# Patient Record
Sex: Male | Born: 1995 | Race: Black or African American | Hispanic: No | Marital: Single | Smoking: Never smoker
Health system: Southern US, Community
[De-identification: ages and names within clinical notes are randomized; demographics above are authoritative.]

---
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
## 2019-08-03 ENCOUNTER — Emergency Department (HOSPITAL_COMMUNITY): Payer: Medicaid Other

## 2019-08-03 ENCOUNTER — Emergency Department (HOSPITAL_COMMUNITY)
Admission: EM | Admit: 2019-08-03 | Discharge: 2019-08-03 | Disposition: A | Discharge status: Home / Self Care | Payer: Medicaid Other | Attending: Emergency Medicine | Admitting: Emergency Medicine

## 2019-08-03 ENCOUNTER — Other Ambulatory Visit: Payer: Self-pay

## 2019-08-03 ENCOUNTER — Encounter (HOSPITAL_COMMUNITY): Payer: Self-pay | Admitting: *Deleted

## 2019-08-03 DIAGNOSIS — Y93I9 Activity, other involving external motion: Secondary | ICD-10-CM | POA: Diagnosis not present

## 2019-08-03 DIAGNOSIS — F19929 Other psychoactive substance use, unspecified with intoxication, unspecified: Secondary | ICD-10-CM | POA: Diagnosis not present

## 2019-08-03 DIAGNOSIS — Y999 Unspecified external cause status: Secondary | ICD-10-CM | POA: Insufficient documentation

## 2019-08-03 DIAGNOSIS — R1032 Left lower quadrant pain: Secondary | ICD-10-CM | POA: Insufficient documentation

## 2019-08-03 DIAGNOSIS — Y92410 Unspecified street and highway as the place of occurrence of the external cause: Secondary | ICD-10-CM | POA: Diagnosis not present

## 2019-08-03 DIAGNOSIS — R1012 Left upper quadrant pain: Secondary | ICD-10-CM | POA: Diagnosis not present

## 2019-08-03 DIAGNOSIS — R0789 Other chest pain: Secondary | ICD-10-CM

## 2019-08-03 DIAGNOSIS — R410 Disorientation, unspecified: Secondary | ICD-10-CM | POA: Diagnosis present

## 2019-08-03 DIAGNOSIS — R0781 Pleurodynia: Secondary | ICD-10-CM | POA: Insufficient documentation

## 2019-08-03 DIAGNOSIS — R109 Unspecified abdominal pain: Secondary | ICD-10-CM

## 2019-08-03 LAB — COMPREHENSIVE METABOLIC PANEL
ALT: 27 U/L (ref 0–44)
AST: 34 U/L (ref 15–41)
Albumin: 4.7 g/dL (ref 3.5–5.0)
Alkaline Phosphatase: 61 U/L (ref 38–126)
Anion gap: 11 (ref 5–15)
BUN: 12 mg/dL (ref 6–20)
CO2: 23 mmol/L (ref 22–32)
Calcium: 9.5 mg/dL (ref 8.9–10.3)
Chloride: 106 mmol/L (ref 98–111)
Creatinine, Ser: 1.21 mg/dL (ref 0.61–1.24)
GFR calc Af Amer: >60 mL/min (ref >60)
GFR calc non Af Amer: >60 mL/min (ref >60)
Glucose, Bld: 111 mg/dL — ABNORMAL HIGH (ref 70–99)
Potassium: 3.2 mmol/L — ABNORMAL LOW (ref 3.5–5.1)
Sodium: 140 mmol/L (ref 135–145)
Total Bilirubin: 0.9 mg/dL (ref 0.3–1.2)
Total Protein: 7.1 g/dL (ref 6.5–8.1)

## 2019-08-03 LAB — PROTIME-INR
INR: 1.2 (ref 0.8–1.2)
Prothrombin Time: 15.3 seconds — ABNORMAL HIGH (ref 11.4–15.2)

## 2019-08-03 LAB — SAMPLE TO BLOOD BANK

## 2019-08-03 LAB — CBC
HCT: 44.9 % (ref 39.0–52.0)
Hemoglobin: 14.5 g/dL (ref 13.0–17.0)
MCH: 28 pg (ref 26.0–34.0)
MCHC: 32.3 g/dL (ref 30.0–36.0)
MCV: 86.7 fL (ref 80.0–100.0)
Platelets: 276 10*3/uL (ref 150–400)
RBC: 5.18 MIL/uL (ref 4.22–5.81)
RDW: 13.1 % (ref 11.5–15.5)
WBC: 7.9 10*3/uL (ref 4.0–10.5)
nRBC: 0 % (ref 0.0–0.2)

## 2019-08-03 LAB — I-STAT CHEM 8, ED
BUN: 12 mg/dL (ref 6–20)
Calcium, Ion: 1.17 mmol/L (ref 1.15–1.40)
Chloride: 103 mmol/L (ref 98–111)
Creatinine, Ser: 1.2 mg/dL (ref 0.61–1.24)
Glucose, Bld: 106 mg/dL — ABNORMAL HIGH (ref 70–99)
HCT: 44 % (ref 39.0–52.0)
Hemoglobin: 15 g/dL (ref 13.0–17.0)
Potassium: 3.1 mmol/L — ABNORMAL LOW (ref 3.5–5.1)
Sodium: 140 mmol/L (ref 135–145)
TCO2: 26 mmol/L (ref 22–32)

## 2019-08-03 LAB — LACTIC ACID, PLASMA: Lactic Acid, Venous: 3.5 mmol/L (ref 0.5–1.9)

## 2019-08-03 LAB — CDS SEROLOGY

## 2019-08-03 LAB — ETHANOL: Alcohol, Ethyl (B): 13 mg/dL — ABNORMAL HIGH (ref <10)

## 2019-08-03 MED ORDER — IOHEXOL 300 MG/ML  SOLN
100.0000 mL | Freq: Once | INTRAMUSCULAR | Status: AC | PRN
  Administered 2019-08-03: 100 mL via INTRAVENOUS

## 2019-08-03 MED ORDER — SODIUM CHLORIDE 0.9 % IV BOLUS: 1000.0000 mL | Freq: Once | INTRAVENOUS | Status: DC

## 2019-08-03 MED ORDER — SODIUM CHLORIDE 0.9 % IV BOLUS
1000.0000 mL | Freq: Once | INTRAVENOUS | Status: AC
  Administered 2019-08-03: 1000 mL via INTRAVENOUS

## 2019-08-03 MED ORDER — HYDROMORPHONE HCL 1 MG/ML IJ SOLN
0.5000 mg | Freq: Once | INTRAMUSCULAR | Status: AC
  Administered 2019-08-03: 0.5 mg via INTRAVENOUS
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
## 2019-08-03 NOTE — ED Notes (Signed)
Terry Reese (Father#[phone]). [name] (Cousin)

## 2019-08-03 NOTE — ED Notes (Signed)
Pt ambulated w/ steady gait, NAD on ambulation, GCS 15, reports generalized soreness.

## 2019-08-03 NOTE — Progress Notes (Signed)
Orthopedic Tech Progress Note Patient Details:  Terry Reese 08/03/1873 [id number] Level 2 ortho visit. Patient ID: Terry Reese, male   DOB: 08/03/1873, 23 y.o.   MRN: [id number]   Braulio Bosch 08/03/2019, 4:52 PM
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
## 2019-08-03 NOTE — Discharge Instructions (Signed)
You were evaluated in the Emergency Department and after careful evaluation, we did not find any emergent condition requiring admission or further testing in the hospital.  Your exam/testing today is overall reassuring.  Please return to the Emergency Department if you experience any worsening of your condition.  We encourage you to follow up with a primary care provider.  Thank you for allowing us to be a part of your care. 

## 2019-08-07 ENCOUNTER — Encounter: Payer: Self-pay | Admitting: Emergency Medicine

## 2019-11-11 ENCOUNTER — Other Ambulatory Visit: Payer: Self-pay

## 2019-11-11 ENCOUNTER — Encounter: Payer: Self-pay | Admitting: Emergency Medicine

## 2019-11-11 ENCOUNTER — Ambulatory Visit
Admission: EM | Admit: 2019-11-11 | Discharge: 2019-11-11 | Disposition: A | Discharge status: Home / Self Care | Payer: Medicaid Other

## 2019-11-11 DIAGNOSIS — J302 Other seasonal allergic rhinitis: Secondary | ICD-10-CM | POA: Diagnosis not present

## 2019-11-11 MED ORDER — FLUTICASONE PROPIONATE 50 MCG/ACT NA SUSP: 2.0000 | Freq: Every day | NASAL | 0 refills | Status: AC

## 2019-11-11 MED ORDER — KETOTIFEN FUMARATE 0.025 % OP SOLN: 1.0000 [drp] | Freq: Two times a day (BID) | OPHTHALMIC | 0 refills | Status: AC

## 2019-11-11 NOTE — ED Triage Notes (Signed)
Patient c/o stuffy nose, watery and itchy eyes started this morning.  Patient reports history of seasonal allergies especially due to pollen.  Patient states that he started taking Zyrtec.

## 2019-11-11 NOTE — ED Provider Notes (Signed)
[facility]    CSN: [id number] Arrival date & time: 11/11/19  1252      History   Chief Complaint Chief Complaint  Patient presents with  . Nasal Congestion    HPI Terry Reese is a 24 y.o. male.   HPI  24 year old male presents with a stuffy nose watery and itchy eyes started this morning.  He has a history of seasonal allergies especially due to tree pollen.  He states that his mom usually treats him but this time he has not been able to get it under control.  He has had no fever or chills.  Does not complain of cough or any shortness of breath.       History reviewed. No pertinent past medical history.  There are no problems to display for this patient.   History reviewed. No pertinent surgical history.     Home Medications    Prior to Admission medications   Medication Sig Start Date End Date Taking? Authorizing Provider  cetirizine (ZYRTEC) 10 MG tablet Take 10 mg by mouth daily.   Yes [provider]  fluticasone (FLONASE) 50 MCG/ACT nasal spray Place 2 sprays into both nostrils daily. 11/11/19   Lutricia Feil, PA-C  ibuprofen (ADVIL,MOTRIN) 600 MG tablet Take 1 tablet (600 mg total) by mouth every 8 (eight) hours as needed. 08/22/16   Tommi Rumps, PA-C  ketotifen (ZADITOR) 0.025 % ophthalmic solution Place 1 drop into both eyes 2 (two) times daily. 11/11/19   Lutricia Feil, PA-C    Family History Family History  Problem Relation Age of Onset  . Healthy Mother   . Healthy Father     Social History Social History   Tobacco Use  . Smoking status: Never Smoker  . Smokeless tobacco: Never Used  Substance Use Topics  . Alcohol use: Yes    Comment: occ  . Drug use: Yes    Types: Marijuana     Allergies   Penicillins   Review of Systems Review of Systems  Constitutional: Positive for activity change. Negative for appetite change, chills, diaphoresis, fatigue and fever.  HENT: Positive for congestion, rhinorrhea  and sinus pressure.   Respiratory: Negative for cough, shortness of breath, wheezing and stridor.   All other systems reviewed and are negative.    Physical Exam Triage Vital Signs ED Triage Vitals  Enc Vitals Group     BP 11/11/19 1310 115/82     Pulse Rate 11/11/19 1310 65     Resp 11/11/19 1310 16     Temp 11/11/19 1310 98.2 F (36.8 C)     Temp Source 11/11/19 1310 Oral     SpO2 11/11/19 1310 100 %     Weight 11/11/19 1308 190 lb (86.2 kg)     Height 11/11/19 1308 5\' 7"  (1.702 m)     Head Circumference --      Peak Flow --      Pain Score 11/11/19 1308 0     Pain Loc --      Pain Edu? --      Excl. in GC? --    No data found.  Updated Vital Signs BP 115/82 (BP Location: Left Arm)   Pulse 65   Temp 98.2 F (36.8 C) (Oral)   Resp 16   Ht 5\' 7"  (1.702 m)   Wt 190 lb (86.2 kg)   SpO2 100%   BMI 29.76 kg/m   Visual Acuity Right Eye Distance:  Left Eye Distance:   Bilateral Distance:    Right Eye Near:   Left Eye Near:    Bilateral Near:     Physical Exam Vitals and nursing note reviewed.  Constitutional:      General: He is not in acute distress.    Appearance: Normal appearance. He is normal weight. He is not ill-appearing or toxic-appearing.  HENT:     Head: Normocephalic and atraumatic.     Right Ear: External ear normal. There is impacted cerumen.     Left Ear: External ear normal. There is impacted cerumen.     Nose: Rhinorrhea present.     Mouth/Throat:     Mouth: Mucous membranes are dry.     Pharynx: No oropharyngeal exudate or posterior oropharyngeal erythema.  Eyes:     Conjunctiva/sclera: Conjunctivae normal.     Comments: Both eyes are puffy periorbital  Cardiovascular:     Rate and Rhythm: Normal rate and regular rhythm.     Heart sounds: Normal heart sounds.  Pulmonary:     Effort: Pulmonary effort is normal.     Breath sounds: Normal breath sounds.  Musculoskeletal:        General: Normal range of motion.     Cervical back:  Normal range of motion and neck supple.  Skin:    General: Skin is warm and dry.  Neurological:     General: No focal deficit present.     Mental Status: He is alert and oriented to person, place, and time.  Psychiatric:        Mood and Affect: Mood normal.        Behavior: Behavior normal.        Thought Content: Thought content normal.        Judgment: Judgment normal.      UC Treatments / Results  Labs (all labs ordered are listed, but only abnormal results are displayed) Labs Reviewed - No data to display  EKG   Radiology No results found.  Procedures Procedures (including critical care time)  Medications Ordered in UC Medications - No data to display  Initial Impression / Assessment and Plan / UC Course  I have reviewed the triage vital signs and the nursing notes.  Pertinent labs & imaging results that were available during my care of the patient were reviewed by me and considered in my medical decision making (see chart for details).   24 year old male presents with history of seasonal allergies and complaining of stuffy nose watery eyes that are itchy.  He has started Zyrtec has not been very effective.  Examination is consistent with seasonal allergies.  I asked him to continue with the Zyrtec on a daily basis.  I will also add Flonase nasal spray that we used 2 sprays in each nostril daily.  Continue this throughout the pollen season.  The eyes have recommended the use of cool compresses as necessary for comfort.  Also prescribed Zaditor eyedrops that you use twice daily.  Is not improving should follow-up with a primary care physician.  Final Clinical Impressions(s) / UC Diagnoses   Final diagnoses:  Seasonal allergies     Discharge Instructions     Use the Flonase nasal spray daily throughout the entire pollen season.  May use cool compresses to your eyes as necessary for comfort.  Continue taking Zyrtec daily.    ED Prescriptions    Medication Sig  Dispense Auth. Provider   fluticasone (FLONASE) 50 MCG/ACT nasal spray Place 2 sprays  into both nostrils daily. 16 g Ovid Curd P, PA-C   ketotifen (ZADITOR) 0.025 % ophthalmic solution Place 1 drop into both eyes 2 (two) times daily. 5 mL Lutricia Feil, PA-C     PDMP not reviewed this encounter.   Lutricia Feil, PA-C 11/11/19 1408

## 2019-11-11 NOTE — Discharge Instructions (Addendum)
Use the Flonase nasal spray daily throughout the entire pollen season.  May use cool compresses to your eyes as necessary for comfort.  Continue taking Zyrtec daily.
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
## 2024-04-13 ENCOUNTER — Emergency Department
Admission: EM | Admit: 2024-04-13 | Discharge: 2024-04-13 | Disposition: A | Discharge status: Home / Self Care | Attending: Emergency Medicine | Admitting: Emergency Medicine

## 2024-04-13 ENCOUNTER — Other Ambulatory Visit: Payer: Self-pay

## 2024-04-13 DIAGNOSIS — Z202 Contact with and (suspected) exposure to infections with a predominantly sexual mode of transmission: Secondary | ICD-10-CM | POA: Diagnosis present

## 2024-04-13 DIAGNOSIS — N489 Disorder of penis, unspecified: Secondary | ICD-10-CM | POA: Insufficient documentation

## 2024-04-13 LAB — CHLAMYDIA/NGC RT PCR (ARMC ONLY)
Chlamydia Tr: NOT DETECTED
N gonorrhoeae: NOT DETECTED

## 2024-04-13 MED ORDER — LIDOCAINE HCL (PF) 1 % IJ SOLN
INTRAMUSCULAR | Status: AC
  Administered 2024-04-13: 2.1 mL
  Filled 2024-04-13: qty 5

## 2024-04-13 MED ORDER — VALACYCLOVIR HCL 500 MG PO TABS: 500.0000 mg | ORAL_TABLET | Freq: Two times a day (BID) | ORAL | 0 refills | Status: AC

## 2024-04-13 MED ORDER — VALACYCLOVIR HCL 500 MG PO TABS
500.0000 mg | ORAL_TABLET | Freq: Once | ORAL | Status: AC
  Administered 2024-04-13: 500 mg via ORAL
  Filled 2024-04-13: qty 1

## 2024-04-13 MED ORDER — DOXYCYCLINE HYCLATE 100 MG PO TABS: 100.0000 mg | ORAL_TABLET | Freq: Two times a day (BID) | ORAL | 0 refills | Status: AC

## 2024-04-13 MED ORDER — CEFTRIAXONE SODIUM 1 G IJ SOLR
500.0000 mg | Freq: Once | INTRAMUSCULAR | Status: AC
  Administered 2024-04-13: 500 mg via INTRAMUSCULAR
  Filled 2024-04-13: qty 10

## 2024-04-13 MED ORDER — DOXYCYCLINE HYCLATE 100 MG PO TABS
100.0000 mg | ORAL_TABLET | Freq: Once | ORAL | Status: AC
  Administered 2024-04-13: 100 mg via ORAL
  Filled 2024-04-13: qty 1

## 2024-04-13 NOTE — ED Provider Notes (Signed)
 [facility] DEPARTMENT AT [facility] Provider Note   CSN: [id number] Arrival date & time: 04/13/24  1105     Patient presents with: Exposure to STD   Terry Reese is a 28 y.o. male.   Patient here for STD exposure.  He has had unprotected intercourse with multiple females.  Now noting a bump on the base of his penis.  He thinks his previous partner had STD but unsure exactly what.  Denies any discharge no dysuria no abdominal pain.  He does note that he had a few small blisters that he popped on the base of his penis   Exposure to STD       Prior to Admission medications   Medication Sig Start Date End Date Taking? Authorizing Provider  doxycycline  (VIBRA -TABS) 100 MG tablet Take 1 tablet (100 mg total) by mouth 2 (two) times daily for 7 days. 04/13/24 04/20/24 Yes Kingston Mallick, PA-C  valACYclovir  (VALTREX ) 500 MG tablet Take 1 tablet (500 mg total) by mouth 2 (two) times daily for 3 days. 04/13/24 04/16/24 Yes Kingston Mallick, PA-C  cetirizine (ZYRTEC) 10 MG tablet Take 10 mg by mouth daily.    [provider]  fluticasone  (FLONASE ) 50 MCG/ACT nasal spray Place 2 sprays into both nostrils daily. 11/11/19   Lacinda Elsie SQUIBB, PA-C  ibuprofen  (ADVIL ,MOTRIN ) 600 MG tablet Take 1 tablet (600 mg total) by mouth every 8 (eight) hours as needed. 08/22/16   Saunders Shona CROME, PA-C  ketotifen  (ZADITOR ) 0.025 % ophthalmic solution Place 1 drop into both eyes 2 (two) times daily. 11/11/19   Lacinda Elsie SQUIBB, PA-C    Allergies: Penicillins    Review of Systems  Genitourinary:  Negative for flank pain, penile swelling, scrotal swelling and testicular pain.  Skin:  Positive for rash.    Updated Vital Signs BP 118/79   Pulse 73   Temp 98.4 F (36.9 C)   Resp 18   Ht 5' 7 (1.702 m)   Wt 81.6 kg   SpO2 100%   BMI 28.19 kg/m   Physical Exam Vitals reviewed.  Constitutional:      Appearance: Normal appearance.  HENT:     Head: Normocephalic and atraumatic.      Nose: Nose normal.  Cardiovascular:     Pulses: Normal pulses.  Pulmonary:     Effort: Pulmonary effort is normal.  Abdominal:     General: There is no distension.     Tenderness: There is no abdominal tenderness.  Genitourinary:    Comments: Penis with no discharge.  Does have a small lesion on the ventral aspect with similar grouped painful ulcers. No inguinal lymphadenopathy Musculoskeletal:     Cervical back: Normal range of motion.  Neurological:     Mental Status: He is alert and oriented to person, place, and time. Mental status is at baseline.  Psychiatric:        Mood and Affect: Mood normal.        Behavior: Behavior normal.     (all labs ordered are listed, but only abnormal results are displayed) Labs Reviewed  CHLAMYDIA/NGC RT PCR ([facility] ONLY)            HIV ANTIBODY (ROUTINE TESTING W REFLEX)  RPR    EKG: None  Radiology: No results found.   Procedures   Medications Ordered in the ED  valACYclovir  (VALTREX ) tablet 500 mg (has no administration in time range)  cefTRIAXone  (ROCEPHIN ) injection 500 mg (has no administration in time range)  doxycycline  (VIBRA -TABS) tablet 100 mg (has no administration in time range)                                    Medical Decision Making Patient here for STD exposure/penile lesion.  No known exposure.  Has had a small bump on exam does appear likely consistent with herpes although difficult to assess due to his complexion.  No severe response no lymphadenopathy the doubt primary HSV.  I have a fairly low suspicion for syphilis however we will check labs including RPR and HIV as well as treat empirically for gonorrhea and chlamydia.  Amount and/or Complexity of Data Reviewed Labs: ordered.  Risk Prescription drug management.        Final diagnoses:  Penile lesion    ED Discharge Orders          Ordered    valACYclovir  (VALTREX ) 500 MG tablet  2 times daily        04/13/24 1227    doxycycline  (VIBRA -TABS)  100 MG tablet  2 times daily        04/13/24 1227               Kingston Mallick, [place] 04/13/24 1228    Levander Slate, MD 04/13/24 [phone]

## 2024-04-13 NOTE — ED Triage Notes (Signed)
 Pt comes in via pov with concerns of a possible std. Pt with no pain with urination, and no reported discharge. Pt states that he notices a bump on the shaft of his penis. Pt states that a previous partner has an STD. Pt has no complaints of pain at this time.

## 2024-04-13 NOTE — Discharge Instructions (Signed)
 Please take antibiotics and antivirals as directed.  Please follow-up with a [place] [facility] for further evaluation and treatment as well as further testing.

## 2024-04-14 LAB — HIV ANTIBODY (ROUTINE TESTING W REFLEX): HIV Screen 4th Generation wRfx: NONREACTIVE

## 2024-04-14 LAB — RPR: RPR Ser Ql: NONREACTIVE

## 2024-04-20 ENCOUNTER — Ambulatory Visit

## 2024-04-20 DIAGNOSIS — Z113 Encounter for screening for infections with a predominantly sexual mode of transmission: Secondary | ICD-10-CM

## 2024-04-20 NOTE — Progress Notes (Signed)
 Terry Reese to [facility] for follow up to discuss his lab results from his ED visit on 04/13/24.  Discussed that his syphilis, HIV and gonorrhea/chlamydia tests were all negative. The single sore that he had on his genitals has gone away. Was somewhat painful, they gave his acyclovir at the ED but did not perform an HSV swab. Discussed if the bumps/sores return to come in for an appt and we can swab the area to determine if it is herpes. He completed his course of doxycycline  that was prescribed presumptively. Also had a shot of ceftriaxone  in ED. Provided reassurance. No new orders at this visit. Discussed that depending on his lifestyle/whether he has multiple/new partners that he can return for STI testing as desired.   Damien Satchel [facility]
